# Patient Record
Sex: Male | Born: 1950 | Race: White | Hispanic: No | Marital: Married
Health system: Southern US, Community
[De-identification: ages and names within clinical notes are randomized; demographics above are authoritative.]

---

## 2009-10-02 ENCOUNTER — Encounter: Payer: Self-pay | Admitting: Internal Medicine

## 2009-10-02 ENCOUNTER — Telehealth (INDEPENDENT_AMBULATORY_CARE_PROVIDER_SITE_OTHER): Payer: Self-pay

## 2009-10-02 ENCOUNTER — Encounter (INDEPENDENT_AMBULATORY_CARE_PROVIDER_SITE_OTHER): Payer: Self-pay | Admitting: *Deleted

## 2009-10-02 ENCOUNTER — Emergency Department (HOSPITAL_BASED_OUTPATIENT_CLINIC_OR_DEPARTMENT_OTHER): Admission: EM | Admit: 2009-10-02 | Discharge: 2009-10-02 | Payer: Self-pay | Admitting: Emergency Medicine

## 2009-10-02 ENCOUNTER — Ambulatory Visit: Payer: Self-pay | Admitting: Diagnostic Radiology

## 2009-10-03 ENCOUNTER — Ambulatory Visit: Payer: Self-pay | Admitting: Gastroenterology

## 2009-10-03 DIAGNOSIS — R110 Nausea: Secondary | ICD-10-CM

## 2009-10-03 DIAGNOSIS — R1012 Left upper quadrant pain: Secondary | ICD-10-CM

## 2009-10-10 ENCOUNTER — Telehealth: Payer: Self-pay | Admitting: Physician Assistant

## 2009-10-18 ENCOUNTER — Ambulatory Visit: Payer: Self-pay | Admitting: Gastroenterology

## 2009-10-18 LAB — CONVERTED CEMR LAB: UREASE: NEGATIVE

## 2009-10-21 ENCOUNTER — Encounter (INDEPENDENT_AMBULATORY_CARE_PROVIDER_SITE_OTHER): Payer: Self-pay | Admitting: *Deleted

## 2009-10-22 ENCOUNTER — Telehealth: Payer: Self-pay | Admitting: Physician Assistant

## 2009-12-18 DIAGNOSIS — K449 Diaphragmatic hernia without obstruction or gangrene: Secondary | ICD-10-CM | POA: Insufficient documentation

## 2009-12-18 DIAGNOSIS — K222 Esophageal obstruction: Secondary | ICD-10-CM | POA: Insufficient documentation

## 2009-12-18 DIAGNOSIS — I10 Essential (primary) hypertension: Secondary | ICD-10-CM | POA: Insufficient documentation

## 2010-03-04 NOTE — Letter (Signed)
Summary: New Patient letter  [facility]  [address] [place], [place] [address]   Phone: [phone]  Fax: [phone]       10/21/2009 MRN: [id number]  Jay [name] [address] [place], [place]  [address]  Dear Mr. [name],  Welcome to the Gastroenterology Division at [facility].    You are scheduled to see Dr.  Jarold Motto on 12-05-09 at 9:30 on the 3rd floor at [facility], [address].  We ask that you try to arrive at our office 15 minutes prior to your appointment time to allow for check-in.  We would like you to complete the enclosed self-administered evaluation form prior to your visit and bring it with you on the day of your appointment.  We will review it with you.  Also, please bring a complete list of all your medications or, if you prefer, bring the medication bottles and we will list them.  Please bring your insurance card so that we may make a copy of it.  If your insurance requires a referral to see a specialist, please bring your referral form from your primary care physician.  Co-payments are due at the time of your visit and may be paid by cash, check or credit card.     Your office visit will consist of a consult with your physician (includes a physical exam), any laboratory testing he/she may order, scheduling of any necessary diagnostic testing (e.g. x-ray, ultrasound, CT-scan), and scheduling of a procedure (e.g. Endoscopy, Colonoscopy) if required.  Please allow enough time on your schedule to allow for any/all of these possibilities.    If you cannot keep your appointment, please call [phone] to cancel or reschedule prior to your appointment date.  This allows [place] the opportunity to schedule an appointment for another patient in need of care.  If you do not cancel or reschedule by 5 p.m. the business day prior to your appointment date, you will be charged a $50.00 late cancellation/no-show fee.    Thank you for choosing  [facility] for your medical needs.  We appreciate the opportunity to care for you.  Please visit [place] at our website  to learn more about our practice.                     Sincerely,                                                             The Gastroenterology Division

## 2010-03-04 NOTE — Progress Notes (Signed)
Summary: Medication  Phone Note Call from Patient Call back at Home Phone [phone]   Caller: Patient Call For: Mike Gip Reason for Call: Refill Medication Summary of Call: Needs a refill on his Perocet...[facility] [place]  Initial call taken by: Karna Christmas,  October 10, 2009 11:38 AM    Prescriptions: PERCOCET 5-325 MG TABS (OXYCODONE-ACETAMINOPHEN) Take 1-2 tablets by mouth every 6 hours as needed for pain (NOT YET TAKING)  #30 x 0   Entered by:   Lowry Ram NCMA   Authorized by:   Sammuel Cooper PA-c   Signed by:   Lowry Ram NCMA on 10/10/2009   Method used:   Print then Give to Patient   RxID:   [id number]  SPoke to pt and he admitted he didn't think he needed the Percocet after leaving the hospital but has had pain and has been taking it.  Amy agreed to sign a prescription and I put it at the front desk for the pt.  He said his wife would pick it up tomorrow and he thanked me for helping with this.

## 2010-03-04 NOTE — Progress Notes (Signed)
Summary: Schedule office visit wiht Amy   Phone Note Other Incoming   Caller: Lina Sar Summary of Call: Dr Juanda Chance recieved a call from Dr Randa Evens at the [facility].  Patient  needs seen here tomorow by Mike Gip PA and then possible EGD Friday at the hospital with Dr Juanda Chance.  Dr Randa Evens was contacted and the patient was scheduled for 10/03/09 10:30.  I have faxed a new patient letter and paperwork to the patient at [facility] Initial call taken by: Darcey Nora RN, CGRN,  October 02, 2009 2:44 PM

## 2010-03-04 NOTE — Assessment & Plan Note (Signed)
Summary: black stools/sheri   History of Present Illness Visit Type: Initial Consult Primary GI MD: Sheryn Bison MD FACP FAGA Primary Provider: [facility] Chief Complaint: Patient c/o 1 week intermittent RUQ abdominal pain with increased belching as well as some midsternal chest discomfort. He also c/o intermittent diarrhea and 1 week dark black stool. However, he feels better today. History of Present Illness:   60 YO MALE NEW TO GI TODAY. HE IS REFERRED VIA [facility]. HE HAS NO PRIOR GI HX. ONSET OF EPIGASTRIC/LUQ PAIN ABOUT  ONE WEEK AGO. HIS PAIN HAS BEEN CONSTANT, AND MOSTLY BURNING IN NATURE.,NO RADIATION. +NAUSEA,ONE EPISODE OF VOMITING WITH STREAK OF BLOOD. NO REGUALR HEARBURN ,HAS HAD SOME INTERMITTENT DYSPHAGIA LONGER TERM. STOOL BLACK OVER THE PAST WEEK BUT ON FURTHER QUESTIONING HE HAS BEEN USING PEPTO BISMAOL. NO FEVER. HE ADMITS TO USING 2-4 GOODY POWDERS MOST DAYS  FOR HEADACHES. ALSO DRINKS ABOUT A PINT OF LIQUOR DAILY AND HAS DONE SO FOR SEVERAL YEARS. LABS IN ER 8/31 ;CBC,CMET,LIPASE ALL NORMAL.  CT ABD/PELVIS 8/31;NORMAL.   GI Review of Systems    Reports abdominal pain, acid reflux, belching, chest pain, nausea, and  vomiting.     Location of  Abdominal pain: RUQ.    Denies bloating, dysphagia with liquids, dysphagia with solids, heartburn, loss of appetite, vomiting blood, weight loss, and  weight gain.      Reports black tarry stools and  diarrhea.     Denies anal fissure, change in bowel habit, constipation, diverticulosis, fecal incontinence, heme positive stool, hemorrhoids, irritable bowel syndrome, jaundice, light color stool, liver problems, rectal bleeding, and  rectal pain. Preventive Screening-Counseling & Management  Alcohol-Tobacco     Smoking Status: quit      Drug Use:  no.      Current Medications (verified): 1)  Percocet 5-325 Mg Tabs (Oxycodone-Acetaminophen) .... Take 1-2 Tablets By Mouth Every 6 Hours As Needed For Pain (Not Yet  Taking) 2)  Protonix 40 Mg Tbec (Pantoprazole Sodium) .... Take 1 Tablet By Mouth Once A Day (Not Yet Taking) 3)  Diovan Hct 320-12.5 Mg Tabs (Valsartan-Hydrochlorothiazide) .... Take 1 Tablet By Mouth Once Daily  Allergies (verified): 1)  ! * Ivp Dye  Past History:  Past Medical History: Hypertension Hx Kidney Stones  Past Surgical History: Lithotripsy Cystoscopy  Family History: Family History of Breast Cancer: Mother No FH of Colon Cancer: Family History of Heart Disease: Grandmother, Grandfather (maternal)  Social History: Alcohol Use - yes-1 pint or more daily MARRIED Illicit Drug Use - no Patient is a former smoker.  Daily Caffeine Use-2 drinks daily Smoking Status:  quit  Review of Systems  The patient denies allergy/sinus, anemia, anxiety-new, arthritis/joint pain, back pain, blood in urine, breast changes/lumps, change in vision, confusion, cough, coughing up blood, depression-new, fainting, fatigue, fever, headaches-new, hearing problems, heart murmur, heart rhythm changes, itching, menstrual pain, muscle pains/cramps, night sweats, nosebleeds, pregnancy symptoms, shortness of breath, skin rash, sleeping problems, sore throat, swelling of feet/legs, swollen lymph glands, thirst - excessive , urination - excessive , urination changes/pain, urine leakage, vision changes, and voice change.         SEE HPI  Vital Signs:  Patient profile:   60 year old male Height:      65 inches Weight:      188.13 pounds BMI:     31.42 BSA:     1.93 Pulse rate:   60 / minute Pulse rhythm:   regular BP sitting:   116 / 72  (  left arm)  Vitals Entered By: Lamona Curl CMA (AAMA) (October 03, 2009 10:20 AM)  Physical Exam  General:  Well developed, well nourished, no acute distress. Head:  Normocephalic and atraumatic. Eyes:  PERRLA, no icterus. Lungs:  Clear throughout to auscultation. Heart:  Regular rate and rhythm; no murmurs, rubs,  or bruits. Abdomen:   PROTUBERANT SOFT,MILD TENDERNESS EPIGASTRIUM AND LUQ, NO GUARDING, NO MASS OR HSM,BS+ Rectal:  NOT DONE,HEME NEGATIVE IN ER Extremities:  No clubbing, cyanosis, edema or deformities noted. Neurologic:  Alert and  oriented x4;  grossly normal neurologically. Psych:  Alert and cooperative. Normal mood and affect.   Impression & Recommendations:  Problem # 1:  ABDOMINAL PAIN, LEFT UPPER QUADRANT (ICD-789.02) Assessment New 59 YO MALE WITH ONE WEEK HX OF CONSTANT LUQ/EPIGASTRIC ABDOMINAL PAIN,WITH NAUSEA,BALCK STOOLS (PEPTO), IN PT WITH CHRONIC ETOH ,AND GOODY POWDER USE. HIGH SUSPICION FOR EROSIVE GASTRITIS OR PUD.  NEGATIVE CT ON 8/30.  START NEXIUM 40 MG TWICE DAILY (SAMPLES GIVEN) DISCUSSED NEED TO STOP ASA/GOODY USE DECREASE ETOH USE TO NO MORE THAN 2 DRINKS /24 HOURS- NO EVIDENCE FOR LIVER DISEASE ON CT OR LABS. SCHEDULE FOR UPPER ENDOSCOPY WITH DR. PATTERSON. PROCEDURE DISCUSSED IN DETAIL WITH PT AND FAMILY Orders: Colon/Endo (Colon/Endo)  Problem # 2:  SPECIAL SCREENING FOR MALIGNANT NEOPLASMS COLON (ICD-V76.51) Assessment: Unchanged NEEDS SCREENING COLONOSCOPY- WILL SCHEDULE   AT TIME OF EGD. PT AGREEABLE, AND PROCEDURE DISCUSSED IN DETAIL. Orders: Colon/Endo (Colon/Endo)  Patient Instructions: 1)  You have been scheduled for an endoscopy/colonoscopy with Dr Sheryn Bison on 10-18-09. Please arrive on the 4th floor [facility] at 2:30 pm for registration. 2)  Pick up your Moviprep kit at the pharmacy. 3)  We have sent you a prescription for Nexium 40 mg that you should take 1 time daily 30 minutes before breakfast. Please pick that prescription up. 4)  Avoid foods high in acid content ( tomatoes, citrus juices, spicy foods) . Avoid eating within 3 to 4 hours of lying down or before exercising. Do not over eat; try smaller more frequent meals. Elevate head of bed four inches when sleeping.  5)  Copy sent to : Dr Sheryn Bison 6)  The medication list was reviewed and reconciled.  All  changed / newly prescribed medications were explained.  A complete medication list was provided to the patient / caregiver. Prescriptions: NEXIUM 40 MG CPDR (ESOMEPRAZOLE MAGNESIUM) Take 1 capsule 30 min prior to breakfast  #30 x 4   Entered by:   Lowry Ram NCMA   Authorized by:   Sammuel Cooper PA-c   Signed by:   Lowry Ram NCMA on 10/03/2009   Method used:   Electronically to        [facility] [phone]* (retail)       [address] [place], [place]  [address]       Ph: [phone]       Fax: [phone]   RxID:   [id number] MOVIPREP 100 GM  SOLR (PEG-KCL-NACL-NASULF-NA ASC-C) As per prep instructions.  #1 x 0   Entered by:   Lowry Ram NCMA   Authorized by:   Sammuel Cooper PA-c   Signed by:   Lowry Ram NCMA on 10/03/2009   Method used:   Electronically to        [facility] [phone]* (retail)       [address]  [place], [place]  [address]       Ph: [phone]       Fax: [phone]   RxID:   [id number]

## 2010-03-04 NOTE — Miscellaneous (Signed)
Summary: Orders Update  Clinical Lists Changes  Orders: Added new Test order of TLB-H Pylori Screen Gastric Biopsy (83013-CLOTEST) - Signed 

## 2010-03-04 NOTE — Progress Notes (Signed)
Summary: Medication refill  Phone Note Call from Patient Call back at Home Phone [phone]   Caller: Patient Call For: Mike Gip, PA Reason for Call: Refill Medication Summary of Call: Needs a refill on his Percocet...sch'd appt for 12-05-09 Initial call taken by: Karna Christmas,  October 22, 2009 9:23 AM  Follow-up for Phone Call        PT'S GI WORK-UP HAS BEEN NEGATIVE-NO FINDINGS TO EXPLAIN PAIN. WILL NOT REFILL PERCOCET AT THIS TIME. Follow-up by: Peterson Ao,  October 23, 2009 1:26 PM     Appended Document: Medication refill Lm for pt at [phone] to advise we will not refill the percodet due to negative findings on GI workup per Mike Gip PA.

## 2010-03-04 NOTE — Procedures (Signed)
Summary: Colonoscopy  Patient: [name] Note: All result statuses are Final unless otherwise noted.  Tests: (1) Colonoscopy (COL)   COL Colonoscopy           DONE     [facility]     [address]     [place], [place]  [address]           COLONOSCOPY PROCEDURE REPORT           PATIENT:  [name], Jay  MR#:  [id number]     BIRTHDATE:  [birth year], 59 yrs. old  GENDER:  male     ENDOSCOPIST:  Vania Rea. Jarold Motto, MD, [facility]     REF. BY:  Mike Gip, PA-C     PROCEDURE DATE:  10/18/2009     PROCEDURE:  Average-risk screening colonoscopy     G0121     ASA CLASS:  Class II     INDICATIONS:  Routine Risk Screening     MEDICATIONS:   Fentanyl 100 mcg IV, Versed 10 mg IV           DESCRIPTION OF PROCEDURE:   After the risks benefits and     alternatives of the procedure were thoroughly explained, informed     consent was obtained.  Digital rectal exam was performed and     revealed no abnormalities.   The LB CF-H180AL [id number] endoscope     was introduced through the anus and advanced to the cecum, which     was identified by both the appendix and ileocecal valve, without     limitations.  The quality of the prep was excellent, using     MoviPrep.  The instrument was then slowly withdrawn as the colon     was fully examined.     <<PROCEDUREIMAGES>>           FINDINGS:  No polyps or cancers were seen.  This was otherwise a     normal examination of the colon.   Retroflexed views in the rectum     revealed no abnormalities.    The scope was then withdrawn from     the patient and the procedure completed.           COMPLICATIONS:  None     ENDOSCOPIC IMPRESSION:     1) No polyps or cancers     2) Otherwise normal examination     RECOMMENDATIONS:     1) Continue current colorectal screening recommendations for     "routine risk" patients with a repeat colonoscopy in 10 years.     REPEAT EXAM:  No           ______________________________     Vania Rea. Jarold Motto,  MD, [facility]           CC:           n.     eSIGNED:   Vania Rea. Patterson at 10/18/2009 03:51 PM           Reather Converse, [id number]  Note: An exclamation mark (!) indicates a result that was not dispersed into the flowsheet. Document Creation Date: 10/18/2009 3:52 PM _______________________________________________________________________  (1) Order result status: Final Collection or observation date-time: 10/18/2009 15:45 Requested date-time:  Receipt date-time:  Reported date-time:  Referring Physician:   Ordering Physician: Sheryn Bison [phone]) Specimen Source:  Source: Launa Grill Order Number: [id number] Lab site:   Appended Document: Colonoscopy    Clinical Lists Changes  Observations: Added new observation of COLONNXTDUE:  10/2019 (10/18/2009 16:00)     

## 2010-03-04 NOTE — Letter (Signed)
Summary: New Patient letter  [facility]  [address] [place], [place] [address]   Phone: [phone]  Fax: [phone]       10/02/2009 MRN: [id number]  Jay [name] [address] [place], [place]  [address]  Dear Mr. Jay [name],  Welcome to the Gastroenterology Division at [facility].    You are scheduled to see Mike Gip PA  on 10/03/09 at 10:30 on the 3rd floor at [facility], [address]  We ask that you try to arrive at our office 15 minutes prior to your appointment time to allow for check-in.  We would like you to complete the enclosed self-administered evaluation form prior to your visit and bring it with you on the day of your appointment.  We will review it with you.  Also, please bring a complete list of all your medications or, if you prefer, bring the medication bottles and we will list them.  Please bring your insurance card so that we may make a copy of it.  If your insurance requires a referral to see a specialist, please bring your referral form from your primary care physician.  Co-payments are due at the time of your visit and may be paid by cash, check or credit card.     Your office visit will consist of a consult with your physician (includes a physical exam), any laboratory testing he/she may order, scheduling of any necessary diagnostic testing (e.g. x-ray, ultrasound, CT-scan), and scheduling of a procedure (e.g. Endoscopy, Colonoscopy) if required.  Please allow enough time on your schedule to allow for any/all of these possibilities.    If you cannot keep your appointment, please call [phone] to cancel or reschedule prior to your appointment date.  This allows [place] the opportunity to schedule an appointment for another patient in need of care.  If you do not cancel or reschedule by 5 p.m. the business day prior to your appointment date, you will be charged a $50.00 late cancellation/no-show fee.    Thank you for  choosing [facility] for your medical needs.  We appreciate the opportunity to care for you.  Please visit [place] at our website  to learn more about our practice.                     Sincerely,                                                             The Gastroenterology Division

## 2010-03-04 NOTE — Letter (Signed)
Summary: [facility] Instructions  [facility]  [address] [place], [place] [address]   Phone: [phone]  Fax: [phone]       PLEAS CARNEAL    1950/07/02    MRN: [id number]        Procedure Day /Date: 10-18-09     Arrival Time: 2:30 PM      Procedure Time: 3:30 PM     Location of Procedure:                    X      [facility] (4th Floor)  PREPARATION FOR COLONOSCOPY WITH MOVIPREP   Starting 5 days prior to your procedure 10-13-09 do not eat nuts, seeds, popcorn, corn, beans, peas,  salads, or any raw vegetables.  Do not take any fiber supplements (e.g. Metamucil, Citrucel, and Benefiber).  THE DAY BEFORE YOUR PROCEDURE         DATE: 10-17-09  DAY: Thursday  1.  Drink clear liquids the entire day-NO SOLID FOOD  2.  Do not drink anything colored red or purple.  Avoid juices with pulp.  No orange juice.  3.  Drink at least 64 oz. (8 glasses) of fluid/clear liquids during the day to prevent dehydration and help the prep work efficiently.  CLEAR LIQUIDS INCLUDE: Water Jello Ice Popsicles Tea (sugar ok, no milk/cream) Powdered fruit flavored drinks Coffee (sugar ok, no milk/cream) Gatorade Juice: apple, white grape, white cranberry  Lemonade Clear bullion, consomm, broth Carbonated beverages (any kind) Strained chicken noodle soup Hard Candy                             4.  In the morning, mix first dose of MoviPrep solution:    Empty 1 Pouch A and 1 Pouch B into the disposable container    Add lukewarm drinking water to the top line of the container. Mix to dissolve    Refrigerate (mixed solution should be used within 24 hrs)  5.  Begin drinking the prep at 5:00 p.m. The MoviPrep container is divided by 4 marks.   Every 15 minutes drink the solution down to the next mark (approximately 8 oz) until the full liter is complete.   6.  Follow completed prep with 16 oz of clear liquid of your choice (Nothing red or purple).  Continue  to drink clear liquids until bedtime.  7.  Before going to bed, mix second dose of MoviPrep solution:    Empty 1 Pouch A and 1 Pouch B into the disposable container    Add lukewarm drinking water to the top line of the container. Mix to dissolve    Refrigerate  THE DAY OF YOUR PROCEDURE      DATE:10-18-09 DAY: Friday  Beginning at 10:30 AM (5 hours before procedure):         1. Every 15 minutes, drink the solution down to the next mark (approx 8 oz) until the full liter is complete.  2. Follow completed prep with 16 oz. of clear liquid of your choice.    3. You may drink clear liquids until 1:30 PM  (2 HOURS BEFORE PROCEDURE).   MEDICATION INSTRUCTIONS  Unless otherwise instructed, you should take regular prescription medications with a small sip of water   as early as possible the morning of your procedure.        OTHER INSTRUCTIONS  You will need a responsible adult at  least 58 years of age to accompany you and drive you home.   This person must remain in the waiting room during your procedure.  Wear loose fitting clothing that is easily removed.  Leave jewelry and other valuables at home.  However, you may wish to bring a book to read or  an iPod/MP3 player to listen to music as you wait for your procedure to start.  Remove all body piercing jewelry and leave at home.  Total time from sign-in until discharge is approximately 2-3 hours.  You should go home directly after your procedure and rest.  You can resume normal activities the  day after your procedure.  The day of your procedure you should not:   Drive   Make legal decisions   Operate machinery   Drink alcohol   Return to work  You will receive specific instructions about eating, activities and medications before you leave.    The above instructions have been reviewed and explained to me by   _______________________    I fully understand and can verbalize these instructions  _____________________________ Date _________

## 2010-03-04 NOTE — Procedures (Signed)
Summary: Upper Endoscopy  Patient: [name] Note: All result statuses are Final unless otherwise noted.  Tests: (1) Upper Endoscopy (EGD)   EGD Upper Endoscopy       DONE     [facility]     [address]     [place], [place]  [address]           ENDOSCOPY PROCEDURE REPORT           PATIENT:  Jay [name], Jay [name]  MR#:  [id number]     BIRTHDATE:  October 31, 1950, 59 yrs. old  GENDER:  male           ENDOSCOPIST:  Vania Rea. Jarold Motto, MD, [facility]     Referred by:           PROCEDURE DATE:  10/18/2009     PROCEDURE:  EGD with biopsy, Elease Hashimoto Dilation of Esophagus     ASA CLASS:  Class II     INDICATIONS:  GERD, dysphagia           MEDICATIONS:   There was residual sedation effect present from     prior procedure., Versed 2 mg IV     TOPICAL ANESTHETIC:           DESCRIPTION OF PROCEDURE:   After the risks benefits and     alternatives of the procedure were thoroughly explained, informed     consent was obtained.  The LB GIF-H180 [id number] endoscope was     introduced through the mouth and advanced to the second portion of     the duodenum, without limitations.  The instrument was slowly     withdrawn as the mucosa was fully examined.     <<PROCEDUREIMAGES>>           Esophagitis was found in the distal esophagus. STRICTURE DILATED     #40F MALONEY.NO VARICES.  Mild gastritis was found in the antrum.     CLO BX. DONE.    Retroflexed views revealed a hiatal hernia.     The scope was then withdrawn from the patient and the procedure     completed.           COMPLICATIONS:  None           ENDOSCOPIC IMPRESSION:     1) Esophagitis in the distal esophagus     2) Mild gastritis in the antrum     3) A hiatal hernia     CHRONIC GERD AND SECONDARY STRICTURE DILATED.     RECOMMENDATIONS:     1) Rx CLO if positive     2) continue PPI     3) post dilation instructions           REPEAT EXAM:  No           ______________________________     Vania Rea. Jarold Motto, MD, [facility]        CC:  Monica Becton, Amy PA-C           n.     eSIGNED:   Vania Rea. Patterson at 10/18/2009 04:04 PM           Reather Converse, [id number]  Note: An exclamation mark (!) indicates a result that was not dispersed into the flowsheet. Document Creation Date: 10/18/2009 4:05 PM _______________________________________________________________________  (1) Order result status: Final Collection or observation date-time: 10/18/2009 15:56 Requested date-time:  Receipt date-time:  Reported date-time:  Referring Physician:   Ordering Physician: Sheryn Bison [phone]) Specimen Source:  Source: Launa Grill  Order Number: [id number] Lab site:

## 2010-04-18 LAB — URINALYSIS, ROUTINE W REFLEX MICROSCOPIC
Hgb urine dipstick: NEGATIVE
Ketones, ur: 15 mg/dL — AB
Nitrite: NEGATIVE

## 2010-04-18 LAB — POCT CARDIAC MARKERS: Troponin i, poc: <0.05 ng/mL (ref 0.00–0.09)

## 2010-04-18 LAB — DIFFERENTIAL
Basophils Absolute: 0.1 10*3/uL (ref 0.0–0.1)
Basophils Relative: 1 % (ref 0–1)
Lymphocytes Relative: 28 % (ref 12–46)
Monocytes Absolute: 0.8 10*3/uL (ref 0.1–1.0)
Monocytes Relative: 13 % — ABNORMAL HIGH (ref 3–12)
Neutrophils Relative %: 54 % (ref 43–77)

## 2010-04-18 LAB — COMPREHENSIVE METABOLIC PANEL
ALT: 22 U/L (ref 0–53)
Albumin: 4.2 g/dL (ref 3.5–5.2)
Alkaline Phosphatase: 69 U/L (ref 39–117)
Calcium: 9.2 mg/dL (ref 8.4–10.5)
GFR calc non Af Amer: 52 mL/min — ABNORMAL LOW (ref >60)
Glucose, Bld: 102 mg/dL — ABNORMAL HIGH (ref 70–99)
Total Protein: 8.2 g/dL (ref 6.0–8.3)

## 2010-04-18 LAB — HEMOCCULT GUIAC POC 1CARD (OFFICE): Fecal Occult Bld: NEGATIVE

## 2010-04-18 LAB — LIPASE, BLOOD: Lipase: 207 U/L (ref 23–300)

## 2012-06-06 IMAGING — CT CT ABD-PELV W/O CM
2 of 4 series · 16 of 46 positions shown, 18 images · non-contrast
Comparison: None

CLINICAL DATA: Left upper quadrant abdominal pain, vomiting and
diarrhea.

CT ABDOMEN AND PELVIS WITHOUT CONTRAST
TECHNIQUE: Multidetector CT imaging of the abdomen and pelvis was
performed following the standard protocol without intravenous
contrast.

[Series 2: abd/pelvis 5.0 b31f · axial · 0.71mm/px · z∈[+928,+1342]mm · 13 of 91 slices shown, 15 images]
[im 4/91  soft-tissue]
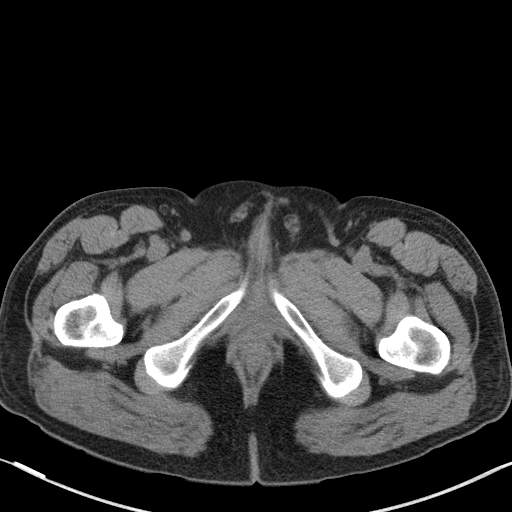
[im 4/91  bone]
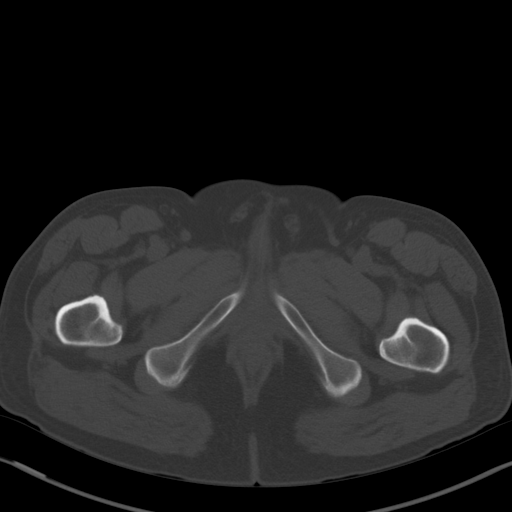
[im 12/91  soft-tissue]
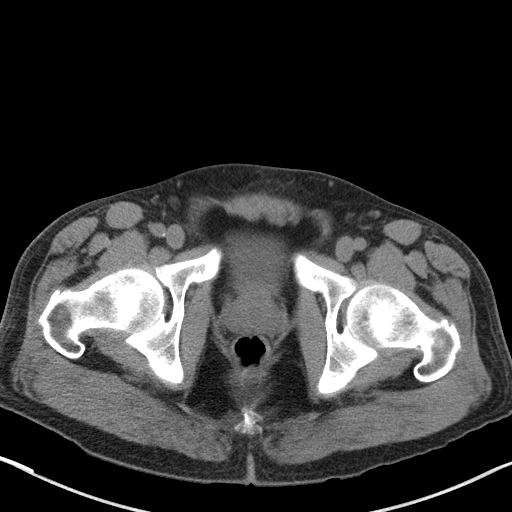
[im 20/91  soft-tissue]
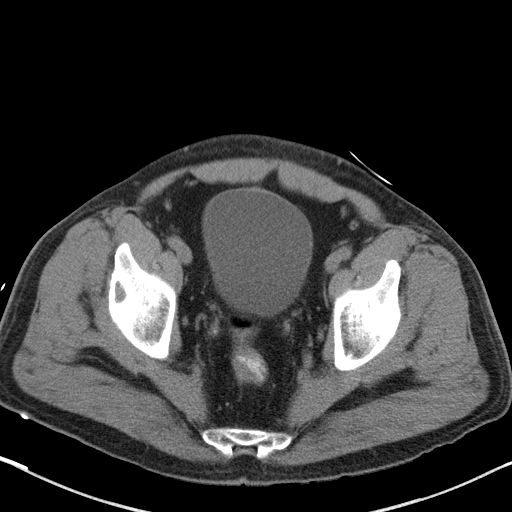
[im 24/91  soft-tissue]
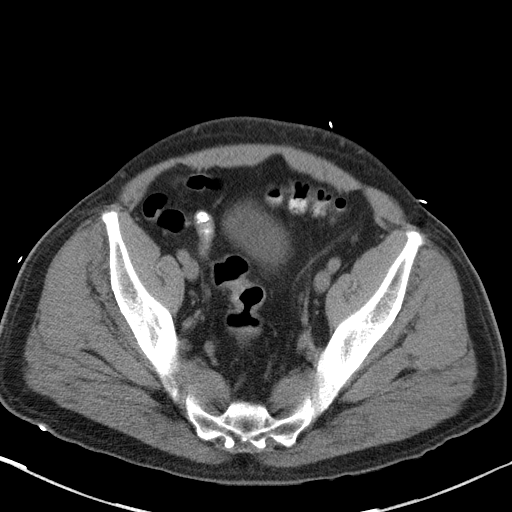
[im 32/91  soft-tissue]
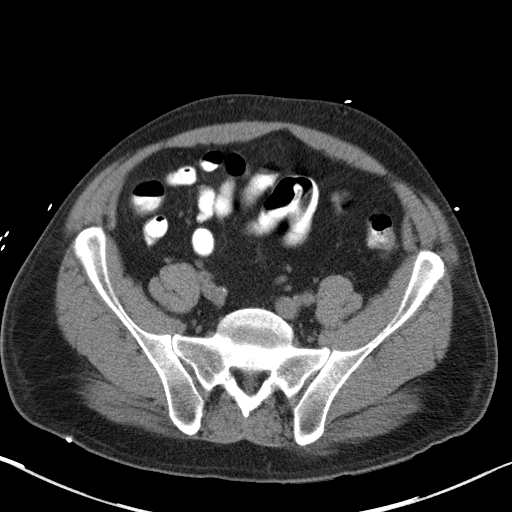
[im 40/91  soft-tissue]
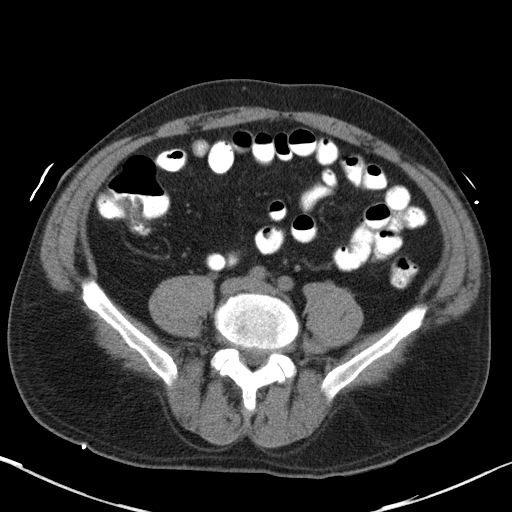
[im 47/91  soft-tissue]
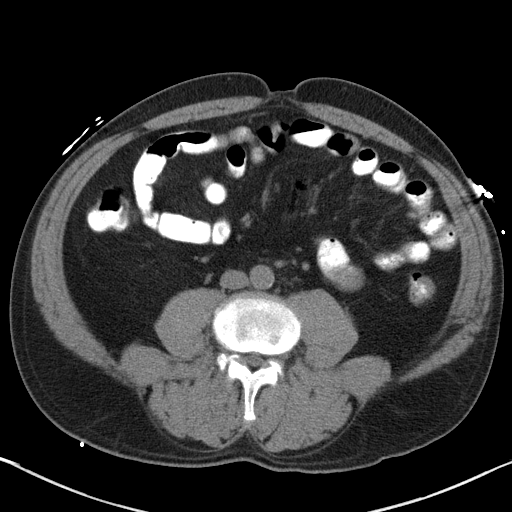
[im 51/91  soft-tissue]
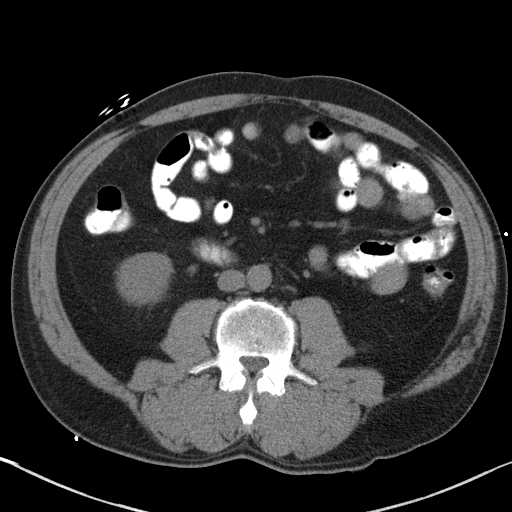
[im 59/91  soft-tissue]
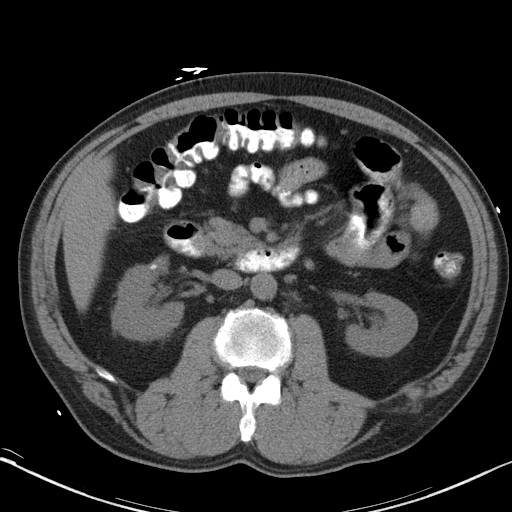
[im 59/91  bone]
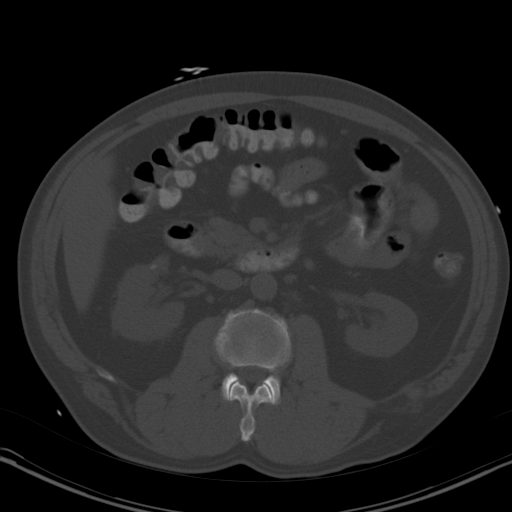
[im 67/91  soft-tissue]
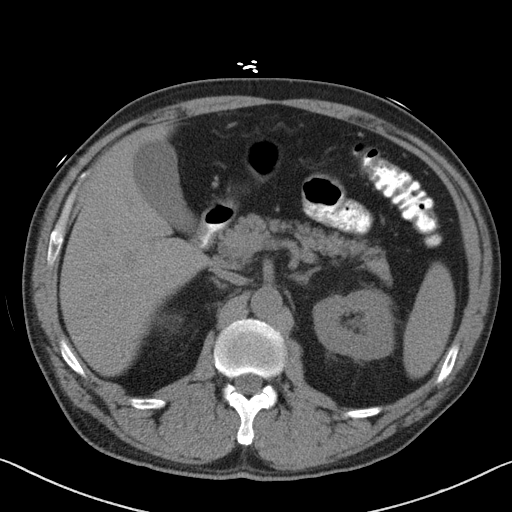
[im 71/91  soft-tissue]
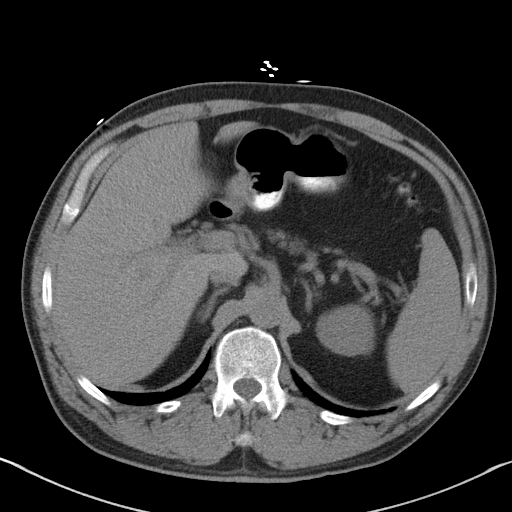
[im 79/91  soft-tissue]
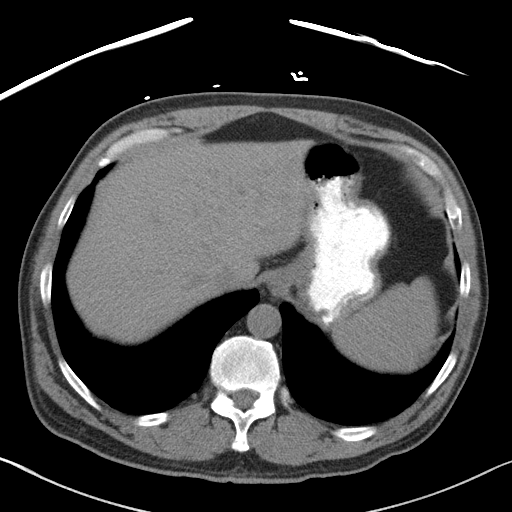
[im 87/91  soft-tissue]
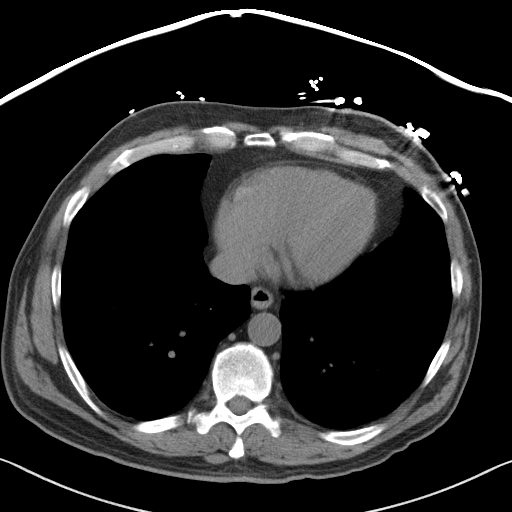

[Series 5: abd/pelvis 3.0 coronal · coronal · 0.73mm/px · 3 of 102 slices shown]
[im 34/102  soft-tissue]
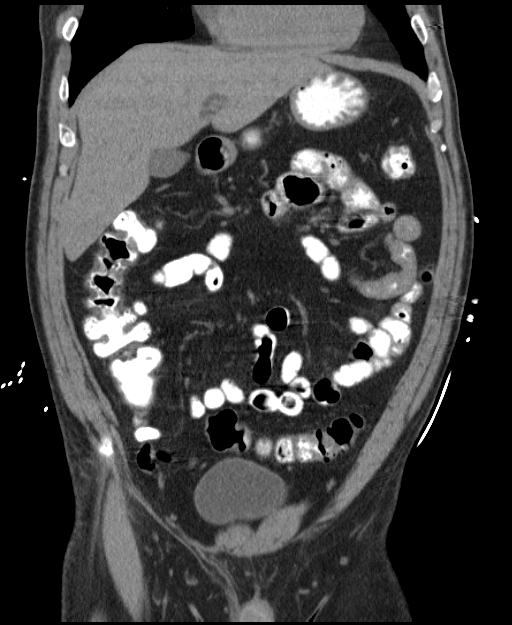
[im 45/102  soft-tissue]
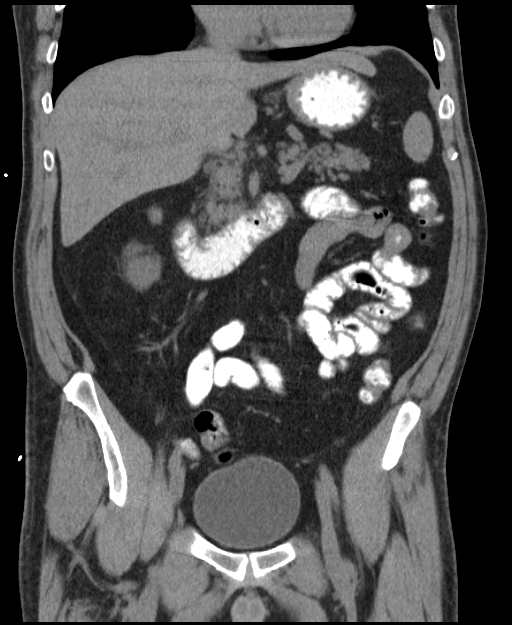
[im 57/102  soft-tissue]
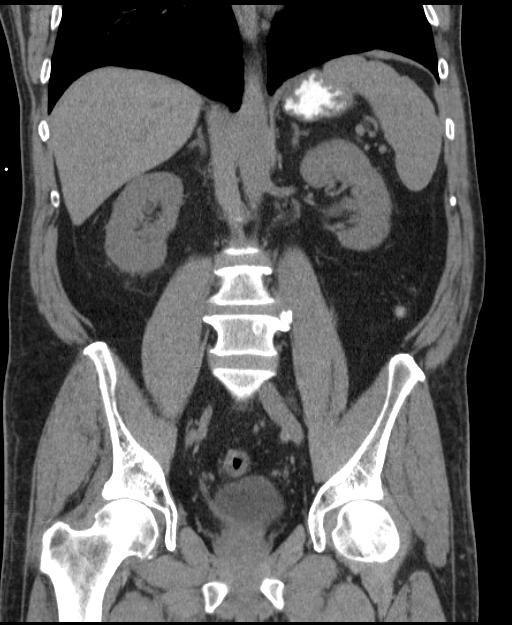

[16 of 46 positions shown; findings below may reference images not displayed]

FINDINGS: The lung bases are clear.

The unenhanced appearance of the liver is unremarkable.  No biliary
dilatation.  The gallbladder is normal.  The common bile duct is
normal in caliber.  The pancreas, spleen and adrenal glands are
normal.  There are bilateral simple appearing renal cysts.  A right
renal calculus is noted. No renal obstructive process.  No ureteral
calculi or bladder calculi.

The stomach, duodenum, small bowel and colon are unremarkable.  No
mesenteric or retroperitoneal masses or adenopathy.  There are a
few small scattered lymph nodes.  The aorta is normal in caliber.
Mild atherosclerotic changes.

The bladder, prostate gland and seminal vesicles are unremarkable.
No pelvic mass, adenopathy or free pelvic fluid collections.  No
inguinal mass or hernia.  No anterior abdominal wall hernia.

The bony structures are unremarkable.
IMPRESSION: Unremarkable CT examination of the abdomen and pelvis without IV
contrast.  No acute abdominal findings, mass lesions or adenopathy.

## 2014-10-15 ENCOUNTER — Encounter: Payer: Self-pay | Admitting: Gastroenterology

## 2017-03-23 DIAGNOSIS — I10 Essential (primary) hypertension: Secondary | ICD-10-CM | POA: Diagnosis not present

## 2017-03-23 DIAGNOSIS — J0140 Acute pansinusitis, unspecified: Secondary | ICD-10-CM | POA: Diagnosis not present

## 2017-03-23 DIAGNOSIS — L4050 Arthropathic psoriasis, unspecified: Secondary | ICD-10-CM | POA: Diagnosis not present

## 2017-04-16 DIAGNOSIS — N522 Drug-induced erectile dysfunction: Secondary | ICD-10-CM | POA: Diagnosis not present

## 2017-04-16 DIAGNOSIS — I10 Essential (primary) hypertension: Secondary | ICD-10-CM | POA: Diagnosis not present

## 2017-04-16 DIAGNOSIS — J189 Pneumonia, unspecified organism: Secondary | ICD-10-CM | POA: Diagnosis not present

## 2017-06-21 DIAGNOSIS — M150 Primary generalized (osteo)arthritis: Secondary | ICD-10-CM | POA: Diagnosis not present

## 2017-06-21 DIAGNOSIS — Z0000 Encounter for general adult medical examination without abnormal findings: Secondary | ICD-10-CM | POA: Diagnosis not present

## 2017-06-21 DIAGNOSIS — N522 Drug-induced erectile dysfunction: Secondary | ICD-10-CM | POA: Diagnosis not present

## 2017-06-21 DIAGNOSIS — I10 Essential (primary) hypertension: Secondary | ICD-10-CM | POA: Diagnosis not present

## 2017-06-21 DIAGNOSIS — Z23 Encounter for immunization: Secondary | ICD-10-CM | POA: Diagnosis not present

## 2017-08-23 DIAGNOSIS — G8911 Acute pain due to trauma: Secondary | ICD-10-CM | POA: Diagnosis not present

## 2017-08-23 DIAGNOSIS — S0181XA Laceration without foreign body of other part of head, initial encounter: Secondary | ICD-10-CM | POA: Diagnosis not present

## 2017-10-23 DIAGNOSIS — K209 Esophagitis, unspecified: Secondary | ICD-10-CM | POA: Diagnosis not present

## 2017-10-23 DIAGNOSIS — K921 Melena: Secondary | ICD-10-CM | POA: Diagnosis not present

## 2017-10-23 DIAGNOSIS — K208 Other esophagitis: Secondary | ICD-10-CM | POA: Diagnosis not present

## 2017-10-23 DIAGNOSIS — N281 Cyst of kidney, acquired: Secondary | ICD-10-CM | POA: Diagnosis not present

## 2017-10-23 DIAGNOSIS — R0789 Other chest pain: Secondary | ICD-10-CM | POA: Diagnosis not present

## 2017-10-23 DIAGNOSIS — Z87442 Personal history of urinary calculi: Secondary | ICD-10-CM | POA: Diagnosis not present

## 2017-10-23 DIAGNOSIS — F1010 Alcohol abuse, uncomplicated: Secondary | ICD-10-CM | POA: Diagnosis not present

## 2017-10-23 DIAGNOSIS — Z888 Allergy status to other drugs, medicaments and biological substances status: Secondary | ICD-10-CM | POA: Diagnosis not present

## 2017-10-23 DIAGNOSIS — K920 Hematemesis: Secondary | ICD-10-CM | POA: Diagnosis not present

## 2017-10-23 DIAGNOSIS — L409 Psoriasis, unspecified: Secondary | ICD-10-CM | POA: Diagnosis not present

## 2017-10-23 DIAGNOSIS — Z91041 Radiographic dye allergy status: Secondary | ICD-10-CM | POA: Diagnosis not present

## 2017-10-23 DIAGNOSIS — R9431 Abnormal electrocardiogram [ECG] [EKG]: Secondary | ICD-10-CM | POA: Diagnosis not present

## 2017-10-23 DIAGNOSIS — K922 Gastrointestinal hemorrhage, unspecified: Secondary | ICD-10-CM | POA: Diagnosis not present

## 2017-10-23 DIAGNOSIS — D62 Acute posthemorrhagic anemia: Secondary | ICD-10-CM | POA: Diagnosis not present

## 2017-10-23 DIAGNOSIS — R1030 Lower abdominal pain, unspecified: Secondary | ICD-10-CM | POA: Diagnosis not present

## 2017-10-23 DIAGNOSIS — R109 Unspecified abdominal pain: Secondary | ICD-10-CM | POA: Diagnosis not present

## 2017-10-23 DIAGNOSIS — Z23 Encounter for immunization: Secondary | ICD-10-CM | POA: Diagnosis not present

## 2017-10-23 DIAGNOSIS — K219 Gastro-esophageal reflux disease without esophagitis: Secondary | ICD-10-CM | POA: Diagnosis not present

## 2017-10-23 DIAGNOSIS — N2889 Other specified disorders of kidney and ureter: Secondary | ICD-10-CM | POA: Diagnosis not present

## 2017-10-23 DIAGNOSIS — K760 Fatty (change of) liver, not elsewhere classified: Secondary | ICD-10-CM | POA: Diagnosis not present

## 2017-10-23 DIAGNOSIS — R1010 Upper abdominal pain, unspecified: Secondary | ICD-10-CM | POA: Diagnosis not present

## 2017-10-23 DIAGNOSIS — K254 Chronic or unspecified gastric ulcer with hemorrhage: Secondary | ICD-10-CM | POA: Diagnosis not present

## 2017-10-23 DIAGNOSIS — N179 Acute kidney failure, unspecified: Secondary | ICD-10-CM | POA: Diagnosis not present

## 2017-10-23 DIAGNOSIS — I10 Essential (primary) hypertension: Secondary | ICD-10-CM | POA: Diagnosis not present

## 2017-10-23 DIAGNOSIS — K259 Gastric ulcer, unspecified as acute or chronic, without hemorrhage or perforation: Secondary | ICD-10-CM | POA: Diagnosis not present

## 2017-10-23 DIAGNOSIS — K250 Acute gastric ulcer with hemorrhage: Secondary | ICD-10-CM | POA: Diagnosis not present

## 2017-10-23 DIAGNOSIS — Z87891 Personal history of nicotine dependence: Secondary | ICD-10-CM | POA: Diagnosis not present

## 2017-10-23 DIAGNOSIS — R112 Nausea with vomiting, unspecified: Secondary | ICD-10-CM | POA: Diagnosis not present

## 2017-10-24 DIAGNOSIS — K922 Gastrointestinal hemorrhage, unspecified: Secondary | ICD-10-CM | POA: Diagnosis not present

## 2017-11-01 ENCOUNTER — Other Ambulatory Visit: Payer: Self-pay

## 2017-11-01 NOTE — Patient Outreach (Signed)
[facility]) Care Management  11/01/2017  Jay [name] 01-Jun-1950 [id number]   Referral Date: 11/01/17 Referral Source: HTA report  Date of Admission: 9/221/9 Diagnosis:  GI Bleed Date of Discharge: Facility:  [facility] Insurance: HTA  Outreach attempt: spoke with patient. He is able to verify HIPAA.  Discussed reason for referral.  Patient reports that he feels better since discharged and has stopped drinking.  Patient reports that he has support from his children.  He states he has an appointment with the GI doctor on 11-15-17 and he does have transportation to the appointment. Patient states he has to call his PCP for a follow up appointment.  Patient states he sees Jay [name] for his PCP.  Patient states he is taking his medications as prescribed and offers no concerns.  Patient unable to review medications on call.    Discussed [facility] services.  Patient declined need at this time.     Plan: RN CM will close case.     Jay Leriche, RN, MSN [facility] Care Management Care Management Coordinator Direct Line [phone] Toll Free: [phone]  Fax: [phone]

## 2017-11-05 DIAGNOSIS — F1021 Alcohol dependence, in remission: Secondary | ICD-10-CM | POA: Diagnosis not present

## 2017-11-05 DIAGNOSIS — N522 Drug-induced erectile dysfunction: Secondary | ICD-10-CM | POA: Diagnosis not present

## 2017-11-05 DIAGNOSIS — K250 Acute gastric ulcer with hemorrhage: Secondary | ICD-10-CM | POA: Diagnosis not present

## 2017-11-11 DIAGNOSIS — R1012 Left upper quadrant pain: Secondary | ICD-10-CM | POA: Diagnosis not present

## 2017-11-11 DIAGNOSIS — K250 Acute gastric ulcer with hemorrhage: Secondary | ICD-10-CM | POA: Diagnosis not present

## 2017-11-12 ENCOUNTER — Other Ambulatory Visit: Payer: Self-pay | Admitting: Pharmacist

## 2017-11-12 NOTE — Patient Outreach (Signed)
[facility]) Care Management  11/12/2017  KIMO BANCROFT 1950-09-20 [id number]  67 year old male referred to [facility] for 30 day discharge medication review.  PMHx includes, but not limited to, HTN, hx gastric ulcer with hematemesis.   Contacted patient. Left HIPAA compliant message on home phone to return my call; cell number was disconnected.   Plan - Will f/u in 2-3 business days if I have not heard back.   Catie Feliz Beam, PharmD PGY2 Ambulatory Care Pharmacy Resident, [facility] Phone: [phone]

## 2017-11-16 ENCOUNTER — Other Ambulatory Visit: Payer: Self-pay | Admitting: Pharmacist

## 2017-11-16 NOTE — Patient Outreach (Signed)
[facility]) Care Management  11/16/2017  Jay [name] 1950/08/12 [id number]  67 year old male referred to [facility] for 30 day discharge medication review.  PMHx includes, but not limited to, HTN, hx gastric ulcer with hematemesis.    Contacted patient. Left HIPAA compliant message on home phone to return my call (unsuccesful outreach #2)  Plan - Will f/u in 2-3 business days if I have not heard back.   Catie Feliz Beam, PharmD PGY2 Ambulatory Care Pharmacy Resident, [facility] Phone: [phone]

## 2017-11-17 ENCOUNTER — Other Ambulatory Visit: Payer: Self-pay | Admitting: Pharmacist

## 2017-11-17 NOTE — Patient Outreach (Signed)
[facility]) Care Management  11/17/2017   REVIS WHALIN [birth year] [id number]  Subjective:   67 year old male referred to [facility] for 30 day discharge medication review.  PMHx includes, but not limited to, hx gastric ulcer w/ hemorrhage, ETOH abuse, HTN.  Contacted patient, HIPAA verifiers identified. Patient notes that he is doing well post discharge. Still having some pain, but improving. Notes that he is "keeping his promise" to avoid alcohol.  Notes that he knows he was told to discontinue Viagra (sildenafil), but he told his physician that if he "had to give up alcohol", he "wasn't going to give up that". He is using PRN.    Objective:   Current Medications:  Current Outpatient Medications  Medication Sig Dispense Refill  . amLODipine (NORVASC) 5 MG tablet Take 5 mg by mouth daily.     Marland Kitchen FERREX 150 150 MG capsule Take 150 mg by mouth 2 (two) times daily.  0  . HYDROcodone-acetaminophen (NORCO/VICODIN) 5-325 MG tablet Take 1 tablet by mouth every 6 (six) hours as needed. for pain  0  . pantoprazole (PROTONIX) 40 MG tablet Take 40 mg by mouth 2 (two) times daily.  0  . sildenafil (REVATIO) 20 MG tablet TAKE THREE TABLETS BY MOUTH DAILY AS NEEDED  2  . promethazine (PHENERGAN) 25 MG tablet TAKE 1 TABLET BY MOUTH EVERY 6 HOURS AS NEEDED FOR NAUSEA FOR UP TO 7 DAYS  0   No current facility-administered medications for this visit.     ASSESSMENT: Date Discharged from Hospital: 10/27/2017 Date Medication Reconciliation Performed: 11/17/2017  Medications Discontinued at Discharge:  Celecoxib  Sildenafil (pt restarted)  New Medications at Discharge:  Hydrocodone-acetaminophen  Iron polysaccharides (Ferrex)  Pantoprazole  Promethazine  Thiamine (patient not taking)  Patient was recently discharged from hospital and all medications have been reviewed   Drugs sorted by system:  Cardiovascular:  amlodipine  Gastrointestinal: pantoprazole, promethazine  Pain: hydrocodone-acetaminophen  Vitamins/Minerals: Ferrex, thiamine   Genitourinary: Sildenafil    Medication Related Findings: - Patient is no longer taking thiamine (vitamin B1); intended duration of therapy is not apparent through [facility]; recommended he clarify with PCP regarding if he should restart this   PLAN: - Will route note to patient's PCP for FYI  - Patient has my contact information should any questions or concerns arise.    Catie Feliz Beam, PharmD PGY2 Ambulatory Care Pharmacy Resident, [facility] Phone: [phone]

## 2017-11-17 NOTE — Patient Outreach (Signed)
[facility]) Care Management  11/17/2017  Jay [name] [birth year] [id number]  67[facility]. PMHx includes, but not limited to, HTN, hx gastric ulcer with hematemesis.   Received call from patient yesterday, he did not leave a message. Returned call today; left HIPAA compliant message for patient to return my call at his convenience.   Catie Feliz Beam, PharmD PGY2 Ambulatory Care Pharmacy Resident, [facility] Phone: [phone]

## 2017-11-24 ENCOUNTER — Other Ambulatory Visit: Payer: Self-pay | Admitting: Radiation Oncology

## 2017-11-24 DIAGNOSIS — K250 Acute gastric ulcer with hemorrhage: Secondary | ICD-10-CM | POA: Diagnosis not present

## 2017-11-24 DIAGNOSIS — F1021 Alcohol dependence, in remission: Secondary | ICD-10-CM | POA: Diagnosis not present

## 2017-11-24 DIAGNOSIS — G894 Chronic pain syndrome: Secondary | ICD-10-CM | POA: Diagnosis not present

## 2017-11-24 MED ORDER — OXYCODONE HCL 5 MG PO TABS: 5.0000 mg | ORAL_TABLET | Freq: Three times a day (TID) | ORAL | 0 refills | Status: DC | PRN

## 2017-11-25 ENCOUNTER — Other Ambulatory Visit: Payer: Self-pay | Admitting: Radiation Oncology

## 2017-12-02 DIAGNOSIS — J189 Pneumonia, unspecified organism: Secondary | ICD-10-CM | POA: Diagnosis not present

## 2018-01-06 DIAGNOSIS — K222 Esophageal obstruction: Secondary | ICD-10-CM | POA: Diagnosis not present

## 2018-01-06 DIAGNOSIS — K2950 Unspecified chronic gastritis without bleeding: Secondary | ICD-10-CM | POA: Diagnosis not present

## 2018-01-06 DIAGNOSIS — K279 Peptic ulcer, site unspecified, unspecified as acute or chronic, without hemorrhage or perforation: Secondary | ICD-10-CM | POA: Diagnosis not present

## 2018-01-06 DIAGNOSIS — K259 Gastric ulcer, unspecified as acute or chronic, without hemorrhage or perforation: Secondary | ICD-10-CM | POA: Diagnosis not present

## 2018-01-06 DIAGNOSIS — K449 Diaphragmatic hernia without obstruction or gangrene: Secondary | ICD-10-CM | POA: Diagnosis not present

## 2018-01-12 DIAGNOSIS — I10 Essential (primary) hypertension: Secondary | ICD-10-CM | POA: Diagnosis not present

## 2018-01-12 DIAGNOSIS — J0140 Acute pansinusitis, unspecified: Secondary | ICD-10-CM | POA: Diagnosis not present

## 2018-07-20 DIAGNOSIS — S20361A Insect bite (nonvenomous) of right front wall of thorax, initial encounter: Secondary | ICD-10-CM | POA: Diagnosis not present

## 2018-07-20 DIAGNOSIS — W57XXXA Bitten or stung by nonvenomous insect and other nonvenomous arthropods, initial encounter: Secondary | ICD-10-CM | POA: Diagnosis not present

## 2018-07-20 DIAGNOSIS — N183 Chronic kidney disease, stage 3 (moderate): Secondary | ICD-10-CM | POA: Diagnosis not present

## 2018-07-20 DIAGNOSIS — N522 Drug-induced erectile dysfunction: Secondary | ICD-10-CM | POA: Diagnosis not present

## 2018-07-20 DIAGNOSIS — L4050 Arthropathic psoriasis, unspecified: Secondary | ICD-10-CM | POA: Diagnosis not present

## 2018-07-20 DIAGNOSIS — F1020 Alcohol dependence, uncomplicated: Secondary | ICD-10-CM | POA: Diagnosis not present

## 2018-08-25 DIAGNOSIS — S20361D Insect bite (nonvenomous) of right front wall of thorax, subsequent encounter: Secondary | ICD-10-CM | POA: Diagnosis not present

## 2018-08-25 DIAGNOSIS — F1010 Alcohol abuse, uncomplicated: Secondary | ICD-10-CM | POA: Diagnosis not present

## 2018-08-25 DIAGNOSIS — N522 Drug-induced erectile dysfunction: Secondary | ICD-10-CM | POA: Diagnosis not present

## 2018-08-25 DIAGNOSIS — I10 Essential (primary) hypertension: Secondary | ICD-10-CM | POA: Diagnosis not present

## 2018-08-25 DIAGNOSIS — F1020 Alcohol dependence, uncomplicated: Secondary | ICD-10-CM | POA: Diagnosis not present

## 2018-08-25 DIAGNOSIS — W57XXXD Bitten or stung by nonvenomous insect and other nonvenomous arthropods, subsequent encounter: Secondary | ICD-10-CM | POA: Diagnosis not present

## 2018-08-25 DIAGNOSIS — N183 Chronic kidney disease, stage 3 (moderate): Secondary | ICD-10-CM | POA: Diagnosis not present

## 2018-08-25 DIAGNOSIS — Z0000 Encounter for general adult medical examination without abnormal findings: Secondary | ICD-10-CM | POA: Diagnosis not present

## 2018-08-25 DIAGNOSIS — D500 Iron deficiency anemia secondary to blood loss (chronic): Secondary | ICD-10-CM | POA: Diagnosis not present

## 2018-08-25 DIAGNOSIS — K210 Gastro-esophageal reflux disease with esophagitis: Secondary | ICD-10-CM | POA: Diagnosis not present

## 2018-12-19 ENCOUNTER — Other Ambulatory Visit: Payer: Self-pay | Admitting: *Deleted

## 2019-08-28 DIAGNOSIS — M8949 Other hypertrophic osteoarthropathy, multiple sites: Secondary | ICD-10-CM | POA: Diagnosis not present

## 2019-08-28 DIAGNOSIS — K2100 Gastro-esophageal reflux disease with esophagitis, without bleeding: Secondary | ICD-10-CM | POA: Diagnosis not present

## 2019-08-28 DIAGNOSIS — I10 Essential (primary) hypertension: Secondary | ICD-10-CM | POA: Diagnosis not present

## 2019-08-28 DIAGNOSIS — N522 Drug-induced erectile dysfunction: Secondary | ICD-10-CM | POA: Diagnosis not present

## 2019-08-28 DIAGNOSIS — F1020 Alcohol dependence, uncomplicated: Secondary | ICD-10-CM | POA: Diagnosis not present

## 2019-08-28 DIAGNOSIS — L4050 Arthropathic psoriasis, unspecified: Secondary | ICD-10-CM | POA: Diagnosis not present

## 2019-08-28 DIAGNOSIS — N1831 Chronic kidney disease, stage 3a: Secondary | ICD-10-CM | POA: Diagnosis not present

## 2019-08-28 DIAGNOSIS — D500 Iron deficiency anemia secondary to blood loss (chronic): Secondary | ICD-10-CM | POA: Diagnosis not present

## 2019-08-28 DIAGNOSIS — Z0000 Encounter for general adult medical examination without abnormal findings: Secondary | ICD-10-CM | POA: Diagnosis not present

## 2019-08-28 DIAGNOSIS — M109 Gout, unspecified: Secondary | ICD-10-CM | POA: Diagnosis not present

## 2019-10-11 DIAGNOSIS — J0100 Acute maxillary sinusitis, unspecified: Secondary | ICD-10-CM | POA: Diagnosis not present

## 2019-10-11 DIAGNOSIS — R05 Cough: Secondary | ICD-10-CM | POA: Diagnosis not present

## 2019-10-11 DIAGNOSIS — N522 Drug-induced erectile dysfunction: Secondary | ICD-10-CM | POA: Diagnosis not present

## 2019-10-11 DIAGNOSIS — Z20822 Contact with and (suspected) exposure to covid-19: Secondary | ICD-10-CM | POA: Diagnosis not present

## 2020-10-01 DIAGNOSIS — R21 Rash and other nonspecific skin eruption: Secondary | ICD-10-CM | POA: Diagnosis not present

## 2020-10-01 DIAGNOSIS — K2100 Gastro-esophageal reflux disease with esophagitis, without bleeding: Secondary | ICD-10-CM | POA: Diagnosis not present

## 2020-10-01 DIAGNOSIS — N522 Drug-induced erectile dysfunction: Secondary | ICD-10-CM | POA: Diagnosis not present

## 2020-10-01 DIAGNOSIS — M25511 Pain in right shoulder: Secondary | ICD-10-CM | POA: Diagnosis not present

## 2020-10-01 DIAGNOSIS — Z125 Encounter for screening for malignant neoplasm of prostate: Secondary | ICD-10-CM | POA: Diagnosis not present

## 2020-10-01 DIAGNOSIS — L4050 Arthropathic psoriasis, unspecified: Secondary | ICD-10-CM | POA: Diagnosis not present

## 2020-10-01 DIAGNOSIS — Z0000 Encounter for general adult medical examination without abnormal findings: Secondary | ICD-10-CM | POA: Diagnosis not present

## 2020-10-01 DIAGNOSIS — N1831 Chronic kidney disease, stage 3a: Secondary | ICD-10-CM | POA: Diagnosis not present

## 2020-10-01 DIAGNOSIS — I10 Essential (primary) hypertension: Secondary | ICD-10-CM | POA: Diagnosis not present

## 2020-10-01 DIAGNOSIS — Z13220 Encounter for screening for lipoid disorders: Secondary | ICD-10-CM | POA: Diagnosis not present

## 2020-10-01 DIAGNOSIS — D500 Iron deficiency anemia secondary to blood loss (chronic): Secondary | ICD-10-CM | POA: Diagnosis not present

## 2020-10-01 DIAGNOSIS — M8949 Other hypertrophic osteoarthropathy, multiple sites: Secondary | ICD-10-CM | POA: Diagnosis not present
